# Patient Record
Sex: Female | Born: 1975 | Race: White | Hispanic: No | Marital: Married | Smoking: Never smoker
Health system: Southern US, Community
[De-identification: ages and names within clinical notes are randomized; demographics above are authoritative.]

## PROBLEM LIST (undated history)

## (undated) HISTORY — PX: ABLATION: SHX5711

---

## 2011-05-25 HISTORY — PX: CHOLECYSTECTOMY: SHX55

## 2020-04-23 HISTORY — PX: ANKLE SURGERY: SHX546

## 2020-10-28 ENCOUNTER — Ambulatory Visit (HOSPITAL_COMMUNITY): Admit: 2020-10-28 | Discharge status: Against Medical Advice | Payer: Self-pay

## 2020-10-30 ENCOUNTER — Ambulatory Visit (HOSPITAL_COMMUNITY): Payer: Self-pay

## 2020-12-31 ENCOUNTER — Other Ambulatory Visit: Payer: BC Managed Care – PPO

## 2020-12-31 DIAGNOSIS — Z20822 Contact with and (suspected) exposure to covid-19: Secondary | ICD-10-CM | POA: Diagnosis not present

## 2021-01-01 LAB — NOVEL CORONAVIRUS, NAA: SARS-CoV-2, NAA: NOT DETECTED

## 2021-01-01 LAB — SARS-COV-2, NAA 2 DAY TAT

## 2021-04-30 DIAGNOSIS — M25552 Pain in left hip: Secondary | ICD-10-CM | POA: Diagnosis not present

## 2021-04-30 DIAGNOSIS — S83281A Other tear of lateral meniscus, current injury, right knee, initial encounter: Secondary | ICD-10-CM | POA: Diagnosis not present

## 2021-04-30 DIAGNOSIS — M545 Low back pain, unspecified: Secondary | ICD-10-CM | POA: Diagnosis not present

## 2021-05-05 DIAGNOSIS — M25561 Pain in right knee: Secondary | ICD-10-CM | POA: Diagnosis not present

## 2021-09-09 DIAGNOSIS — N920 Excessive and frequent menstruation with regular cycle: Secondary | ICD-10-CM | POA: Diagnosis not present

## 2021-09-09 DIAGNOSIS — M791 Myalgia, unspecified site: Secondary | ICD-10-CM | POA: Diagnosis not present

## 2021-09-09 DIAGNOSIS — Z01419 Encounter for gynecological examination (general) (routine) without abnormal findings: Secondary | ICD-10-CM | POA: Diagnosis not present

## 2021-09-09 DIAGNOSIS — Z124 Encounter for screening for malignant neoplasm of cervix: Secondary | ICD-10-CM | POA: Diagnosis not present

## 2021-09-09 DIAGNOSIS — Z1231 Encounter for screening mammogram for malignant neoplasm of breast: Secondary | ICD-10-CM | POA: Diagnosis not present

## 2021-09-09 DIAGNOSIS — N926 Irregular menstruation, unspecified: Secondary | ICD-10-CM | POA: Diagnosis not present

## 2021-09-09 DIAGNOSIS — Z139 Encounter for screening, unspecified: Secondary | ICD-10-CM | POA: Diagnosis not present

## 2021-09-22 DIAGNOSIS — R7401 Elevation of levels of liver transaminase levels: Secondary | ICD-10-CM | POA: Diagnosis not present

## 2021-09-22 DIAGNOSIS — Z131 Encounter for screening for diabetes mellitus: Secondary | ICD-10-CM | POA: Diagnosis not present

## 2021-09-22 DIAGNOSIS — N920 Excessive and frequent menstruation with regular cycle: Secondary | ICD-10-CM | POA: Diagnosis not present

## 2021-09-22 DIAGNOSIS — N393 Stress incontinence (female) (male): Secondary | ICD-10-CM | POA: Diagnosis not present

## 2021-10-21 DIAGNOSIS — D259 Leiomyoma of uterus, unspecified: Secondary | ICD-10-CM | POA: Diagnosis not present

## 2021-10-21 DIAGNOSIS — N840 Polyp of corpus uteri: Secondary | ICD-10-CM | POA: Diagnosis not present

## 2021-10-21 DIAGNOSIS — N920 Excessive and frequent menstruation with regular cycle: Secondary | ICD-10-CM | POA: Diagnosis not present

## 2021-10-26 DIAGNOSIS — R7989 Other specified abnormal findings of blood chemistry: Secondary | ICD-10-CM | POA: Diagnosis not present

## 2021-10-26 DIAGNOSIS — Z6833 Body mass index (BMI) 33.0-33.9, adult: Secondary | ICD-10-CM | POA: Diagnosis not present

## 2021-10-26 DIAGNOSIS — E1169 Type 2 diabetes mellitus with other specified complication: Secondary | ICD-10-CM | POA: Diagnosis not present

## 2021-10-26 DIAGNOSIS — Z8719 Personal history of other diseases of the digestive system: Secondary | ICD-10-CM | POA: Diagnosis not present

## 2021-10-26 DIAGNOSIS — R635 Abnormal weight gain: Secondary | ICD-10-CM | POA: Diagnosis not present

## 2021-11-04 DIAGNOSIS — Z09 Encounter for follow-up examination after completed treatment for conditions other than malignant neoplasm: Secondary | ICD-10-CM | POA: Diagnosis not present

## 2021-11-04 DIAGNOSIS — D259 Leiomyoma of uterus, unspecified: Secondary | ICD-10-CM | POA: Diagnosis not present

## 2021-11-04 DIAGNOSIS — N920 Excessive and frequent menstruation with regular cycle: Secondary | ICD-10-CM | POA: Diagnosis not present

## 2021-11-15 DIAGNOSIS — Z8719 Personal history of other diseases of the digestive system: Secondary | ICD-10-CM | POA: Diagnosis not present

## 2021-11-15 DIAGNOSIS — R635 Abnormal weight gain: Secondary | ICD-10-CM | POA: Diagnosis not present

## 2021-11-15 DIAGNOSIS — E1169 Type 2 diabetes mellitus with other specified complication: Secondary | ICD-10-CM | POA: Diagnosis not present

## 2021-11-15 DIAGNOSIS — R7989 Other specified abnormal findings of blood chemistry: Secondary | ICD-10-CM | POA: Diagnosis not present

## 2021-11-16 ENCOUNTER — Encounter: Payer: Self-pay | Admitting: Obstetrics and Gynecology

## 2021-11-26 DIAGNOSIS — N920 Excessive and frequent menstruation with regular cycle: Secondary | ICD-10-CM | POA: Diagnosis not present

## 2021-11-26 DIAGNOSIS — D259 Leiomyoma of uterus, unspecified: Secondary | ICD-10-CM | POA: Diagnosis not present

## 2021-11-29 DIAGNOSIS — M25561 Pain in right knee: Secondary | ICD-10-CM | POA: Diagnosis not present

## 2021-12-14 DIAGNOSIS — R635 Abnormal weight gain: Secondary | ICD-10-CM | POA: Diagnosis not present

## 2021-12-14 DIAGNOSIS — E669 Obesity, unspecified: Secondary | ICD-10-CM | POA: Diagnosis not present

## 2021-12-14 DIAGNOSIS — R748 Abnormal levels of other serum enzymes: Secondary | ICD-10-CM | POA: Diagnosis not present

## 2021-12-21 ENCOUNTER — Other Ambulatory Visit: Payer: Self-pay | Admitting: Gastroenterology

## 2021-12-21 DIAGNOSIS — R7989 Other specified abnormal findings of blood chemistry: Secondary | ICD-10-CM

## 2021-12-23 ENCOUNTER — Other Ambulatory Visit: Payer: Self-pay

## 2021-12-23 ENCOUNTER — Ambulatory Visit (INDEPENDENT_AMBULATORY_CARE_PROVIDER_SITE_OTHER): Payer: BC Managed Care – PPO | Admitting: Internal Medicine

## 2021-12-23 ENCOUNTER — Encounter: Payer: Self-pay | Admitting: Internal Medicine

## 2021-12-23 VITALS — BP 122/80 | HR 100 | Temp 98.3°F | Ht 64.0 in | Wt 206.4 lb

## 2021-12-23 DIAGNOSIS — R7309 Other abnormal glucose: Secondary | ICD-10-CM | POA: Diagnosis not present

## 2021-12-23 DIAGNOSIS — R748 Abnormal levels of other serum enzymes: Secondary | ICD-10-CM | POA: Diagnosis not present

## 2021-12-23 DIAGNOSIS — Z6835 Body mass index (BMI) 35.0-35.9, adult: Secondary | ICD-10-CM

## 2021-12-23 NOTE — Progress Notes (Signed)
?Jeri Cos Llittleton,acting as a Neurosurgeon for Gwynneth Aliment, MD.,have documented all relevant documentation on the behalf of Gwynneth Aliment, MD,as directed by  Gwynneth Aliment, MD while in the presence of Gwynneth Aliment, MD.  ?This visit occurred during the SARS-CoV-2 public health emergency.  Safety protocols were in place, including screening questions prior to the visit, additional usage of staff PPE, and extensive cleaning of exam room while observing appropriate contact time as indicated for disinfecting solutions. ? ?Subjective:  ?  ? Patient ID: Heidi Wilson , female    DOB: [birth year] , 46 y.o.   MRN: [id number] ? ? ?Chief Complaint  ?Patient presents with  ? Establish Care  ? Weight Check  ? ? ?HPI ? ?Patient presents today to establish primary care. She was referred by her GYN, Dr. Su Hilt for elevated a1c. She moved here from [place] in Aug of 2021. She is originally from [place].  Her initial evaluation was with Dr. Su Hilt in Nov 2022. AT that visit, she was found to have elevated LFTs, and elevated blood sugars. States from Feb 2022 through July 2022 , she had decreased mobility due to left ankle pain, which she thinks contributed to both conditions.  ? ?She was also sent to Endocrinology for further evaluation of elevated sugars, but was not started on any medication.  ? ?  ? ?History reviewed. No pertinent past medical history.  ? ?Family History  ?Problem Relation Age of Onset  ? Hyperlipidemia Father   ? Hypertension Father   ? Rheum arthritis Maternal Grandmother   ? Heart attack Maternal Grandfather   ? Diabetes Paternal Grandmother   ? Heart attack Paternal Grandfather   ? ? ?No current outpatient medications on file.  ? ?No Known Allergies  ? ?Review of Systems  ?Constitutional: Negative.   ?Respiratory: Negative.    ?Cardiovascular: Negative.   ?Gastrointestinal: Negative.   ?Neurological: Negative.   ?Psychiatric/Behavioral: Negative.     ? ?Today's Vitals  ? 12/23/21 1156  ?BP:  122/80  ?Pulse: 100  ?Temp: 98.3 ?F (36.8 ?C)  ?Weight: 206 lb 6.4 oz (93.6 kg)  ?Height: 5\' 4"  (1.626 m)  ?PainSc: 0-No pain  ? ?Body mass index is 35.43 kg/m?.  ?Wt Readings from Last 3 Encounters:  ?12/23/21 206 lb 6.4 oz (93.6 kg)  ?  ? ?Objective:  ?Physical Exam ?Vitals and nursing note reviewed.  ?Constitutional:   ?   Appearance: Normal appearance. She is obese.  ?HENT:  ?   Head: Normocephalic and atraumatic.  ?   Nose:  ?   Comments: Masked  ?   Mouth/Throat:  ?   Comments: Masked  ?Eyes:  ?   Extraocular Movements: Extraocular movements intact.  ?Cardiovascular:  ?   Rate and Rhythm: Normal rate and regular rhythm.  ?   Heart sounds: Normal heart sounds.  ?Pulmonary:  ?   Effort: Pulmonary effort is normal.  ?   Breath sounds: Normal breath sounds.  ?Musculoskeletal:  ?   Cervical back: Normal range of motion.  ?Skin: ?   General: Skin is warm.  ?Neurological:  ?   General: No focal deficit present.  ?   Mental Status: She is alert.  ?Psychiatric:     ?   Mood and Affect: Mood normal.     ?   Behavior: Behavior normal.  ?  ? ?   ?Assessment And Plan:  ?   ?1. Elevated liver enzymes ?Comments: Declines repeat labs today. She is encouraged  to decrease sugar intake, increase daily activity and may benefit from milk thistle.  ? ?2. Elevated glucose ?Comments: I did review some labwork she had from [place] during her visit. Her a1c was greater than 6.5 (9.8) with Dr. Su Hilt. I will request her records from Endocrinology. She does not wish to start meds, although would benefit from Metformin and/or GLP-1 agonist. Pt advised that I will be able to make further recommendations once I receive her records from Endo. She did not appear to be accepting of any pharmacologic therapy at this time.  ? ?3. Body mass index (BMI) of 35.0 to 35.9 in adult ?Comments: She is encouraged to strive ofr BMI <30 to decrease cardiac risk. Advised to aim for at least 150 minutes of exercise per week.  ?  ?Patient was given  opportunity to ask questions. Patient verbalized understanding of the plan and was able to repeat key elements of the plan. All questions were answered to their satisfaction.  ? ?I, Gwynneth Aliment, MD, have reviewed all documentation for this visit. The documentation on 12/23/21 for the exam, diagnosis, procedures, and orders are all accurate and complete.  ? ?IF YOU HAVE BEEN REFERRED TO A SPECIALIST, IT MAY TAKE 1-2 WEEKS TO SCHEDULE/PROCESS THE REFERRAL. IF YOU HAVE NOT HEARD FROM US/SPECIALIST IN TWO WEEKS, PLEASE GIVE [place] A CALL AT [phone].  ? ?THE PATIENT IS ENCOURAGED TO PRACTICE SOCIAL DISTANCING DUE TO THE COVID-19 PANDEMIC.   ?

## 2021-12-27 ENCOUNTER — Other Ambulatory Visit: Payer: BC Managed Care – PPO

## 2021-12-29 ENCOUNTER — Ambulatory Visit
Admission: RE | Admit: 2021-12-29 | Discharge: 2021-12-29 | Disposition: A | Discharge status: Home / Self Care | Payer: BC Managed Care – PPO | Source: Ambulatory Visit | Attending: Gastroenterology | Admitting: Gastroenterology

## 2021-12-29 DIAGNOSIS — R7989 Other specified abnormal findings of blood chemistry: Secondary | ICD-10-CM

## 2021-12-31 ENCOUNTER — Other Ambulatory Visit: Payer: Self-pay | Admitting: Gastroenterology

## 2021-12-31 ENCOUNTER — Other Ambulatory Visit (HOSPITAL_COMMUNITY): Payer: Self-pay | Admitting: Gastroenterology

## 2021-12-31 DIAGNOSIS — R935 Abnormal findings on diagnostic imaging of other abdominal regions, including retroperitoneum: Secondary | ICD-10-CM

## 2021-12-31 DIAGNOSIS — R7989 Other specified abnormal findings of blood chemistry: Secondary | ICD-10-CM

## 2022-01-10 ENCOUNTER — Other Ambulatory Visit: Payer: Self-pay

## 2022-01-10 ENCOUNTER — Ambulatory Visit (HOSPITAL_COMMUNITY)
Admission: RE | Admit: 2022-01-10 | Discharge: 2022-01-10 | Disposition: A | Discharge status: Home / Self Care | Payer: BC Managed Care – PPO | Source: Ambulatory Visit | Attending: Gastroenterology | Admitting: Gastroenterology

## 2022-01-10 DIAGNOSIS — R935 Abnormal findings on diagnostic imaging of other abdominal regions, including retroperitoneum: Secondary | ICD-10-CM | POA: Diagnosis not present

## 2022-01-10 DIAGNOSIS — K7689 Other specified diseases of liver: Secondary | ICD-10-CM | POA: Diagnosis not present

## 2022-01-10 DIAGNOSIS — R7989 Other specified abnormal findings of blood chemistry: Secondary | ICD-10-CM | POA: Insufficient documentation

## 2022-01-12 DIAGNOSIS — M25561 Pain in right knee: Secondary | ICD-10-CM | POA: Diagnosis not present

## 2022-01-20 DIAGNOSIS — M25561 Pain in right knee: Secondary | ICD-10-CM | POA: Diagnosis not present

## 2022-01-26 DIAGNOSIS — M25561 Pain in right knee: Secondary | ICD-10-CM | POA: Diagnosis not present

## 2022-02-07 DIAGNOSIS — M2241 Chondromalacia patellae, right knee: Secondary | ICD-10-CM | POA: Diagnosis not present

## 2022-02-07 DIAGNOSIS — M6281 Muscle weakness (generalized): Secondary | ICD-10-CM | POA: Diagnosis not present

## 2022-02-14 DIAGNOSIS — R7989 Other specified abnormal findings of blood chemistry: Secondary | ICD-10-CM | POA: Diagnosis not present

## 2022-02-17 DIAGNOSIS — M2241 Chondromalacia patellae, right knee: Secondary | ICD-10-CM | POA: Diagnosis not present

## 2022-02-17 DIAGNOSIS — M6281 Muscle weakness (generalized): Secondary | ICD-10-CM | POA: Diagnosis not present

## 2022-02-21 DIAGNOSIS — E1169 Type 2 diabetes mellitus with other specified complication: Secondary | ICD-10-CM | POA: Diagnosis not present

## 2022-02-21 DIAGNOSIS — M6281 Muscle weakness (generalized): Secondary | ICD-10-CM | POA: Diagnosis not present

## 2022-02-21 DIAGNOSIS — R7989 Other specified abnormal findings of blood chemistry: Secondary | ICD-10-CM | POA: Diagnosis not present

## 2022-02-21 DIAGNOSIS — M2241 Chondromalacia patellae, right knee: Secondary | ICD-10-CM | POA: Diagnosis not present

## 2022-02-21 DIAGNOSIS — R635 Abnormal weight gain: Secondary | ICD-10-CM | POA: Diagnosis not present

## 2022-02-21 DIAGNOSIS — Z8719 Personal history of other diseases of the digestive system: Secondary | ICD-10-CM | POA: Diagnosis not present

## 2022-02-28 DIAGNOSIS — M2241 Chondromalacia patellae, right knee: Secondary | ICD-10-CM | POA: Diagnosis not present

## 2022-02-28 DIAGNOSIS — M6281 Muscle weakness (generalized): Secondary | ICD-10-CM | POA: Diagnosis not present

## 2022-03-07 DIAGNOSIS — M6281 Muscle weakness (generalized): Secondary | ICD-10-CM | POA: Diagnosis not present

## 2022-03-07 DIAGNOSIS — M2241 Chondromalacia patellae, right knee: Secondary | ICD-10-CM | POA: Diagnosis not present

## 2022-03-09 DIAGNOSIS — M2241 Chondromalacia patellae, right knee: Secondary | ICD-10-CM | POA: Diagnosis not present

## 2022-06-28 DIAGNOSIS — E1169 Type 2 diabetes mellitus with other specified complication: Secondary | ICD-10-CM | POA: Diagnosis not present

## 2022-06-28 DIAGNOSIS — K7581 Nonalcoholic steatohepatitis (NASH): Secondary | ICD-10-CM | POA: Diagnosis not present

## 2022-06-29 DIAGNOSIS — M2241 Chondromalacia patellae, right knee: Secondary | ICD-10-CM | POA: Diagnosis not present

## 2022-08-09 DIAGNOSIS — Z6833 Body mass index (BMI) 33.0-33.9, adult: Secondary | ICD-10-CM | POA: Diagnosis not present

## 2022-08-09 DIAGNOSIS — Z794 Long term (current) use of insulin: Secondary | ICD-10-CM | POA: Diagnosis not present

## 2022-08-09 DIAGNOSIS — E1169 Type 2 diabetes mellitus with other specified complication: Secondary | ICD-10-CM | POA: Diagnosis not present

## 2022-08-15 DIAGNOSIS — K7581 Nonalcoholic steatohepatitis (NASH): Secondary | ICD-10-CM | POA: Diagnosis not present

## 2022-08-15 DIAGNOSIS — Z23 Encounter for immunization: Secondary | ICD-10-CM | POA: Diagnosis not present

## 2022-08-15 DIAGNOSIS — E1169 Type 2 diabetes mellitus with other specified complication: Secondary | ICD-10-CM | POA: Diagnosis not present

## 2022-08-15 DIAGNOSIS — E1165 Type 2 diabetes mellitus with hyperglycemia: Secondary | ICD-10-CM | POA: Diagnosis not present

## 2022-09-21 DIAGNOSIS — E1169 Type 2 diabetes mellitus with other specified complication: Secondary | ICD-10-CM | POA: Diagnosis not present

## 2022-09-21 DIAGNOSIS — I10 Essential (primary) hypertension: Secondary | ICD-10-CM | POA: Diagnosis not present

## 2022-09-21 DIAGNOSIS — K7581 Nonalcoholic steatohepatitis (NASH): Secondary | ICD-10-CM | POA: Diagnosis not present

## 2022-10-12 DIAGNOSIS — M9901 Segmental and somatic dysfunction of cervical region: Secondary | ICD-10-CM | POA: Diagnosis not present

## 2022-10-12 DIAGNOSIS — M9902 Segmental and somatic dysfunction of thoracic region: Secondary | ICD-10-CM | POA: Diagnosis not present

## 2022-10-12 DIAGNOSIS — M7541 Impingement syndrome of right shoulder: Secondary | ICD-10-CM | POA: Diagnosis not present

## 2022-10-12 DIAGNOSIS — M9907 Segmental and somatic dysfunction of upper extremity: Secondary | ICD-10-CM | POA: Diagnosis not present

## 2022-10-14 DIAGNOSIS — M9902 Segmental and somatic dysfunction of thoracic region: Secondary | ICD-10-CM | POA: Diagnosis not present

## 2022-10-14 DIAGNOSIS — M7541 Impingement syndrome of right shoulder: Secondary | ICD-10-CM | POA: Diagnosis not present

## 2022-10-14 DIAGNOSIS — M9907 Segmental and somatic dysfunction of upper extremity: Secondary | ICD-10-CM | POA: Diagnosis not present

## 2022-10-14 DIAGNOSIS — M9901 Segmental and somatic dysfunction of cervical region: Secondary | ICD-10-CM | POA: Diagnosis not present

## 2022-10-18 DIAGNOSIS — M9901 Segmental and somatic dysfunction of cervical region: Secondary | ICD-10-CM | POA: Diagnosis not present

## 2022-10-18 DIAGNOSIS — M7541 Impingement syndrome of right shoulder: Secondary | ICD-10-CM | POA: Diagnosis not present

## 2022-10-18 DIAGNOSIS — M9907 Segmental and somatic dysfunction of upper extremity: Secondary | ICD-10-CM | POA: Diagnosis not present

## 2022-10-18 DIAGNOSIS — M9902 Segmental and somatic dysfunction of thoracic region: Secondary | ICD-10-CM | POA: Diagnosis not present

## 2022-10-25 DIAGNOSIS — M9907 Segmental and somatic dysfunction of upper extremity: Secondary | ICD-10-CM | POA: Diagnosis not present

## 2022-10-25 DIAGNOSIS — M9902 Segmental and somatic dysfunction of thoracic region: Secondary | ICD-10-CM | POA: Diagnosis not present

## 2022-10-25 DIAGNOSIS — M9901 Segmental and somatic dysfunction of cervical region: Secondary | ICD-10-CM | POA: Diagnosis not present

## 2022-10-25 DIAGNOSIS — M7541 Impingement syndrome of right shoulder: Secondary | ICD-10-CM | POA: Diagnosis not present

## 2022-11-02 DIAGNOSIS — M9907 Segmental and somatic dysfunction of upper extremity: Secondary | ICD-10-CM | POA: Diagnosis not present

## 2022-11-02 DIAGNOSIS — M9901 Segmental and somatic dysfunction of cervical region: Secondary | ICD-10-CM | POA: Diagnosis not present

## 2022-11-02 DIAGNOSIS — M9902 Segmental and somatic dysfunction of thoracic region: Secondary | ICD-10-CM | POA: Diagnosis not present

## 2022-11-02 DIAGNOSIS — M7541 Impingement syndrome of right shoulder: Secondary | ICD-10-CM | POA: Diagnosis not present

## 2022-11-10 DIAGNOSIS — M9902 Segmental and somatic dysfunction of thoracic region: Secondary | ICD-10-CM | POA: Diagnosis not present

## 2022-11-10 DIAGNOSIS — M9907 Segmental and somatic dysfunction of upper extremity: Secondary | ICD-10-CM | POA: Diagnosis not present

## 2022-11-10 DIAGNOSIS — M9901 Segmental and somatic dysfunction of cervical region: Secondary | ICD-10-CM | POA: Diagnosis not present

## 2022-11-10 DIAGNOSIS — M7541 Impingement syndrome of right shoulder: Secondary | ICD-10-CM | POA: Diagnosis not present

## 2022-12-15 DIAGNOSIS — M9907 Segmental and somatic dysfunction of upper extremity: Secondary | ICD-10-CM | POA: Diagnosis not present

## 2022-12-15 DIAGNOSIS — M9902 Segmental and somatic dysfunction of thoracic region: Secondary | ICD-10-CM | POA: Diagnosis not present

## 2022-12-15 DIAGNOSIS — M7541 Impingement syndrome of right shoulder: Secondary | ICD-10-CM | POA: Diagnosis not present

## 2022-12-15 DIAGNOSIS — M9901 Segmental and somatic dysfunction of cervical region: Secondary | ICD-10-CM | POA: Diagnosis not present

## 2023-01-27 DIAGNOSIS — M25572 Pain in left ankle and joints of left foot: Secondary | ICD-10-CM | POA: Diagnosis not present

## 2023-02-13 DIAGNOSIS — M25561 Pain in right knee: Secondary | ICD-10-CM | POA: Diagnosis not present

## 2023-02-13 DIAGNOSIS — M25572 Pain in left ankle and joints of left foot: Secondary | ICD-10-CM | POA: Diagnosis not present

## 2023-03-08 DIAGNOSIS — M79675 Pain in left toe(s): Secondary | ICD-10-CM | POA: Diagnosis not present

## 2023-09-05 IMAGING — US US ABDOMEN LIMITED
1 series · 14 of 25 positions shown · non-contrast
Comparison: None.

CLINICAL DATA: Elevated LFTs.

EXAM:
ULTRASOUND ABDOMEN LIMITED RIGHT UPPER QUADRANT

[Series 1: us abdomen limited · 0.30mm/px · 14 of 36 slices shown]
[im 1/36]
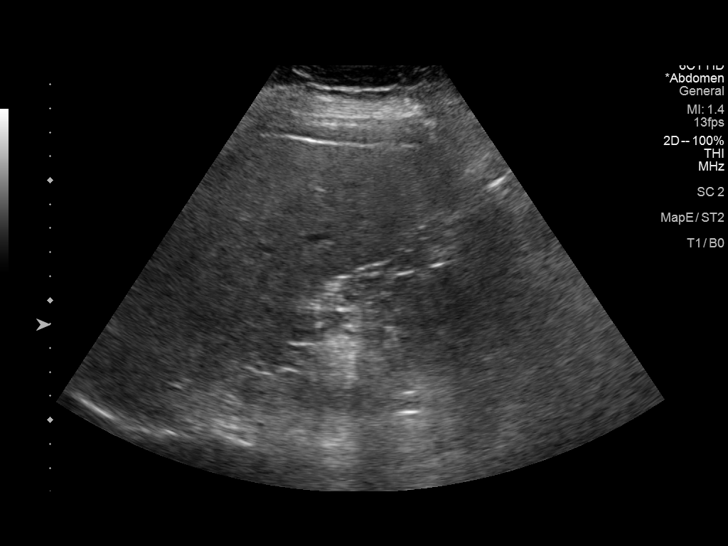
[im 3/36]
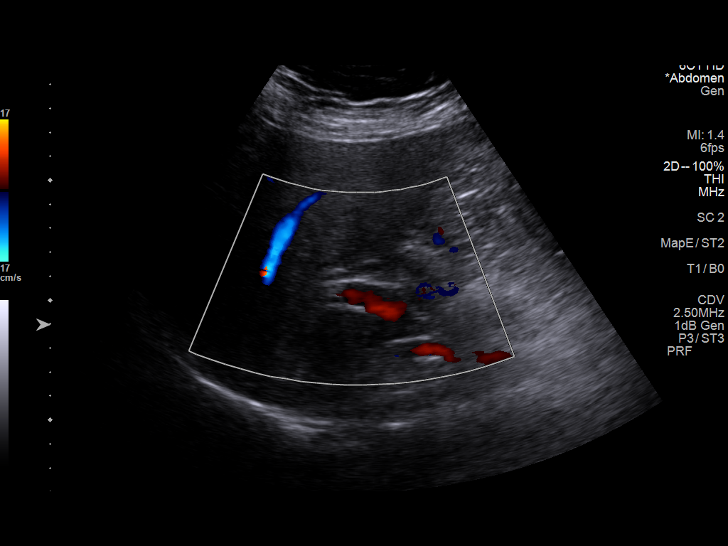
[im 6/36]
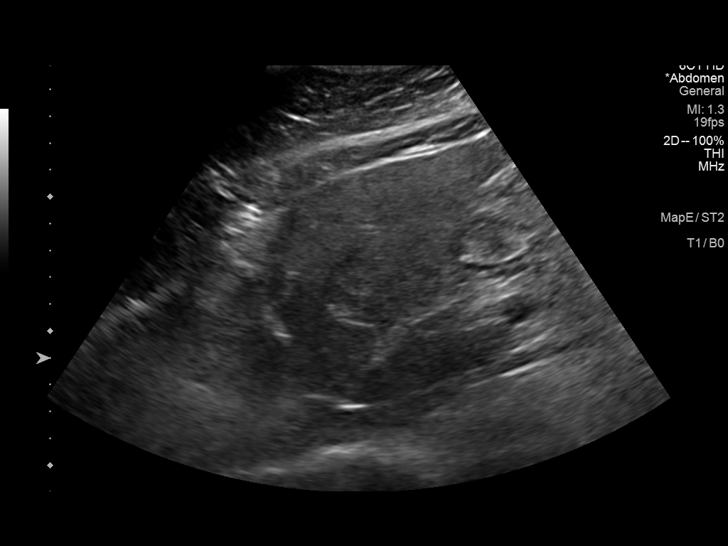
[im 9/36]
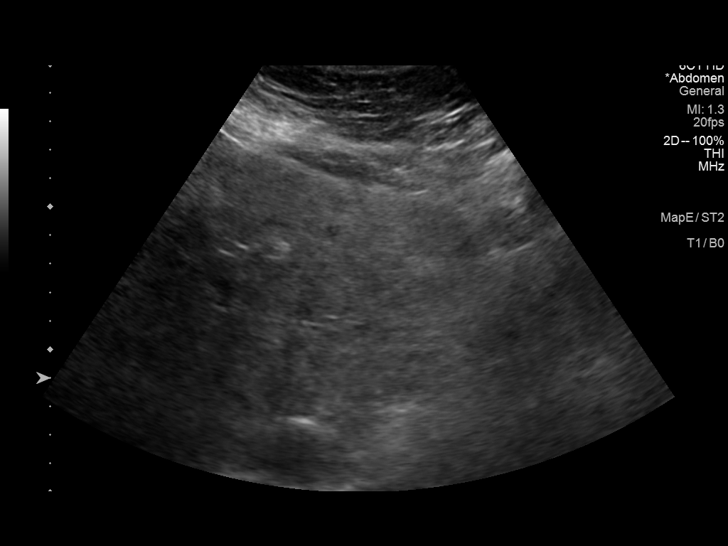
[im 12/36]
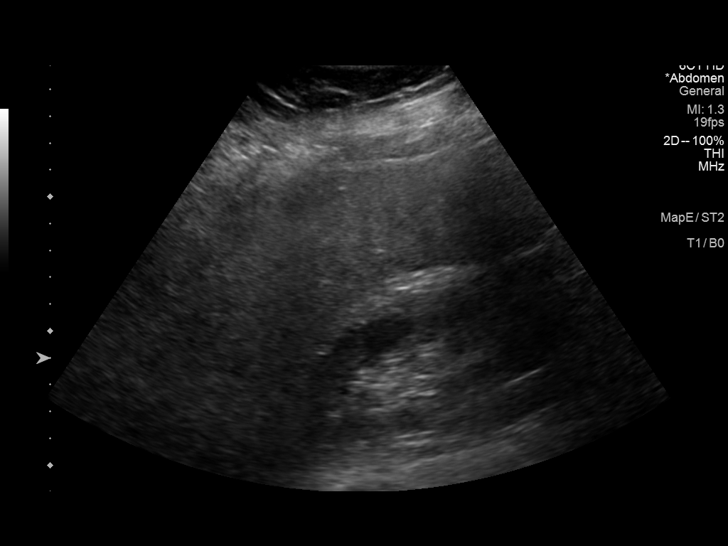
[im 14/36]
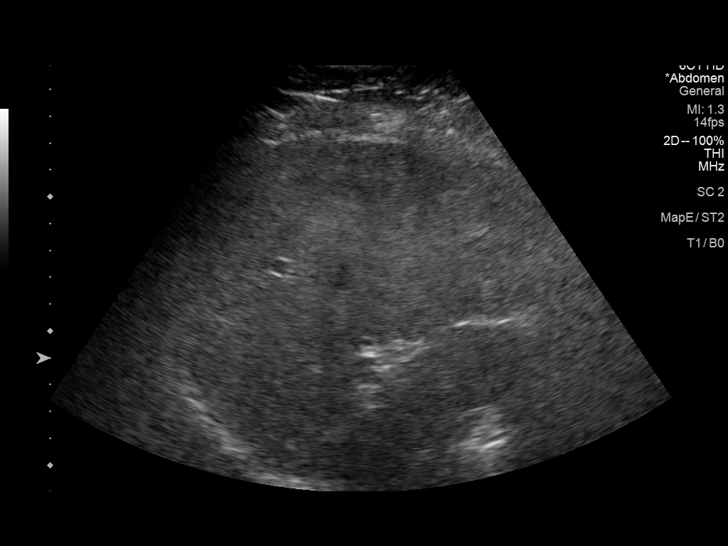
[im 17/36]
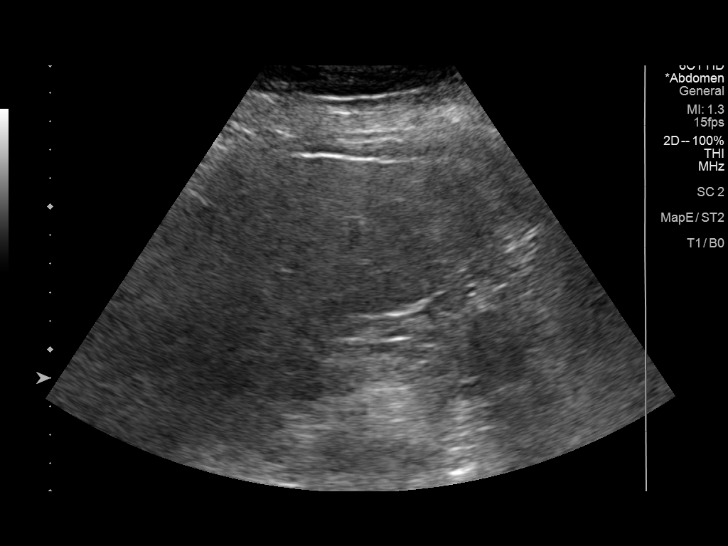
[im 19/36]
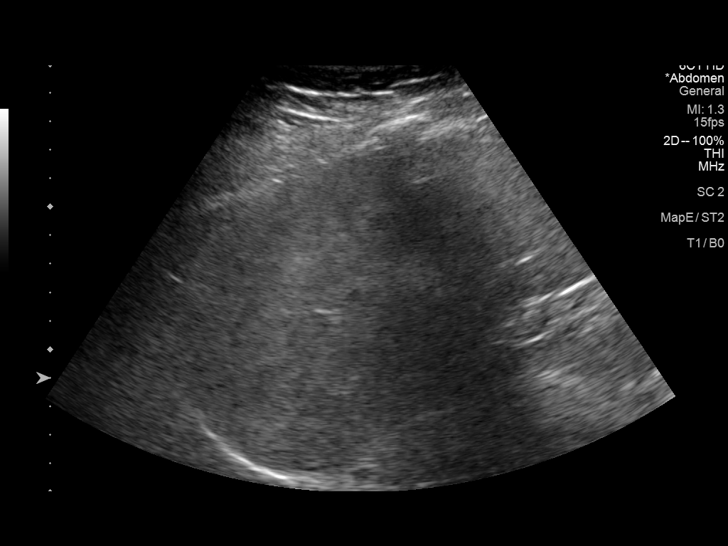
[im 22/36]
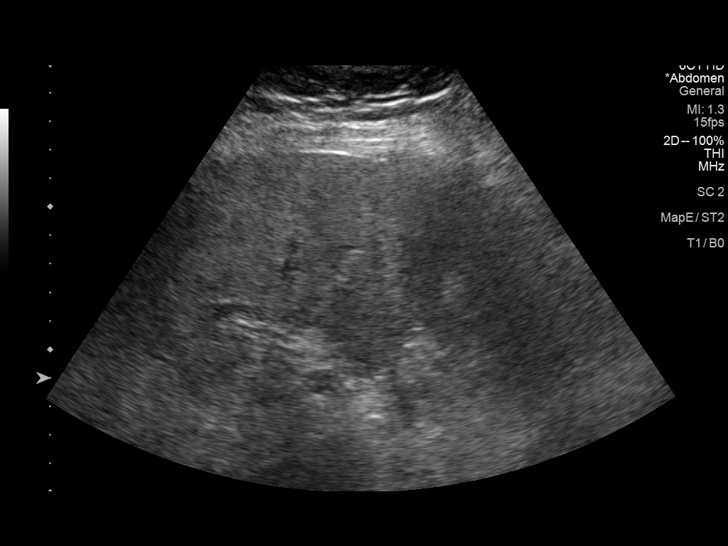
[im 24/36]
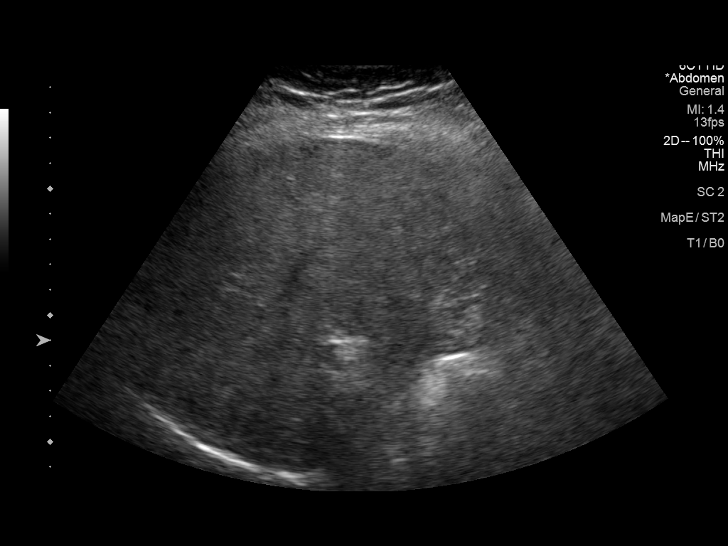
[im 27/36]
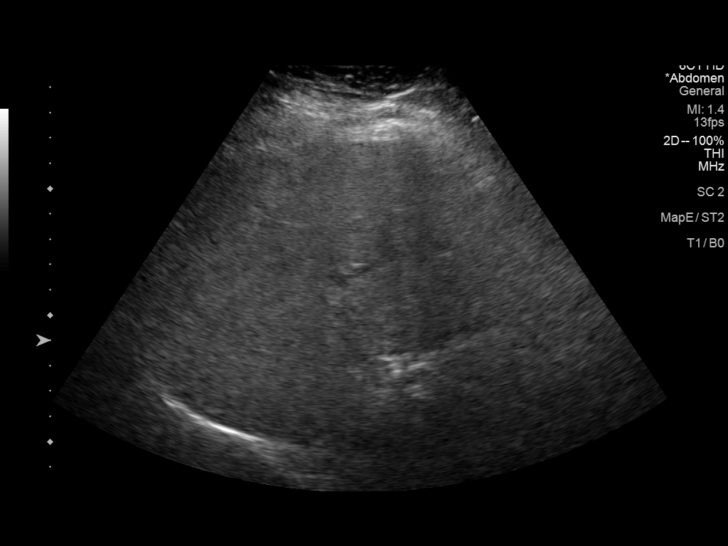
[im 30/36]
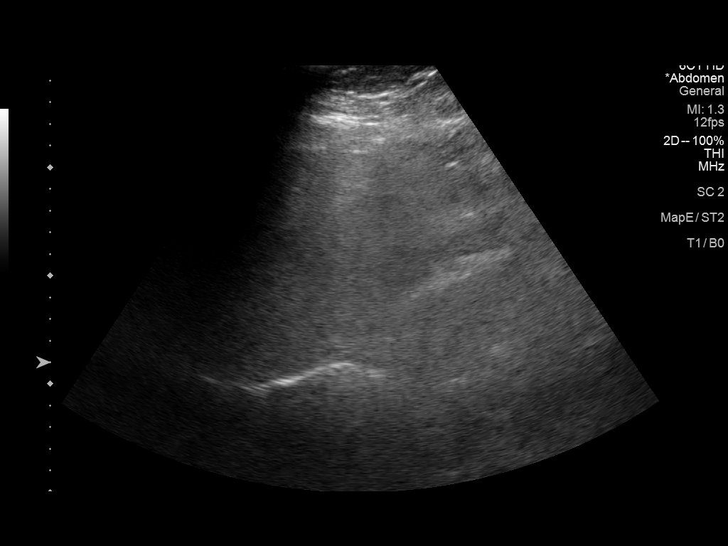
[im 33/36]
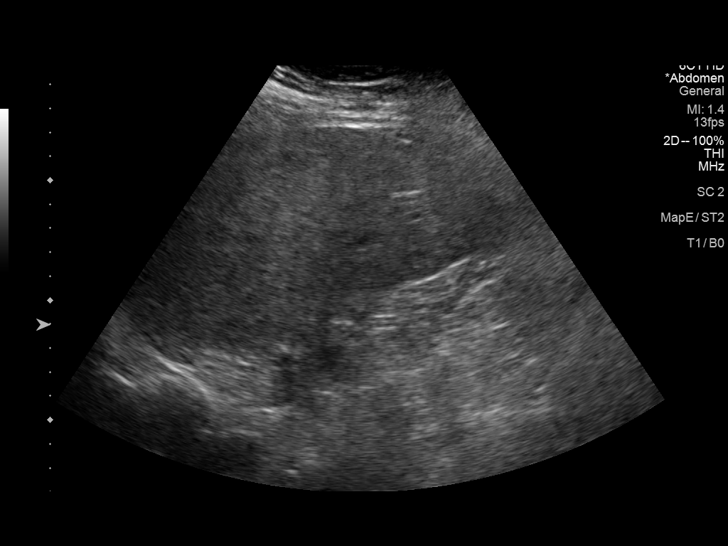
[im 36/36]
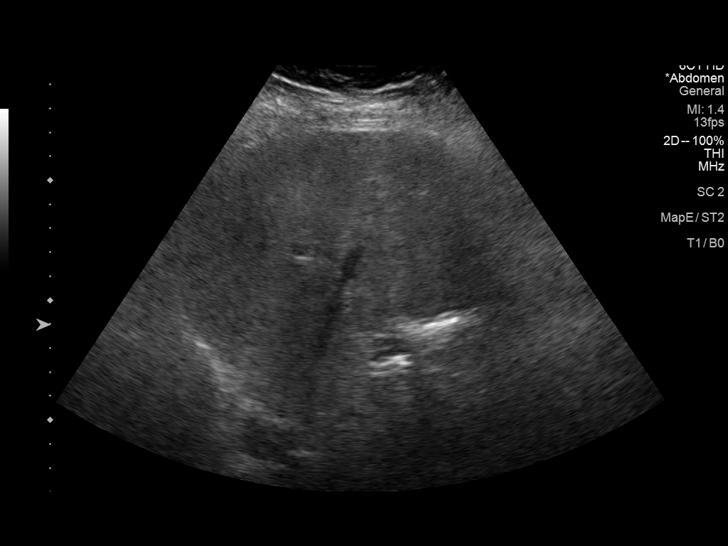

[14 of 25 positions shown; findings below may reference images not displayed]

FINDINGS: Gallbladder:

The gallbladder is surgically absent.

Common bile duct:

Diameter: 5 mm within normal limits.

Liver:

Smooth liver borders. No focal lesion is seen. There is
heterogeneous hepatic echotexture. Portal vein is patent on color
Doppler imaging with normal direction of blood flow towards the
liver.

Other: None.
IMPRESSION: :
IMPRESSION: 1. Status post cholecystectomy.
2. There is a coarsened hepatic echotexture which is nonspecific but
can be seen in the settings of fatty infiltration, fibrosis, and/or
hepatitis.

## 2023-09-17 IMAGING — US US LIVER ELASTOGRAPHY
2 series · 12 of 25 positions shown · non-contrast
Comparison: 12/29/2021

CLINICAL DATA: Elevated liver function tests.

EXAM:
US LIVER ELASTOGRAPHY
TECHNIQUE: Sonography of the liver was performed. In addition, ultrasound
elastography evaluation of the liver was performed. A region of
interest was placed within the right lobe of the liver. Following
application of a compressive sonographic pulse, tissue
compressibility was assessed. Multiple assessments were performed at
the selected site. Median tissue compressibility was determined.
Previously, hepatic stiffness was assessed by shear wave velocity.
Based on recently published Society of [employer]iologists in Ultrasound
consensus article, reporting is now recommended to be performed in
the SI units of pressure (kiloPascals) representing hepatic
stiffness/elasticity. The obtained result is compared to the
published reference standards. (cACLD = compensated Advanced Chronic
Liver Disease)

[Series 1: us elastography liver · 6 of 28 slices shown]
[im 3/28]
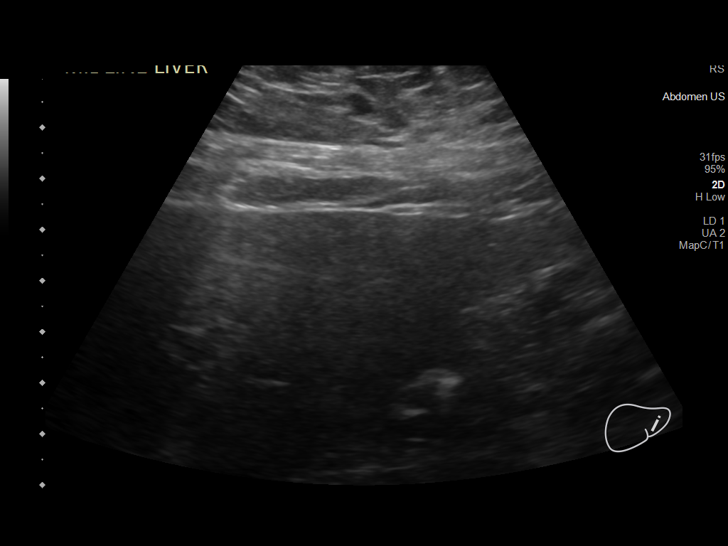
[im 7/28]
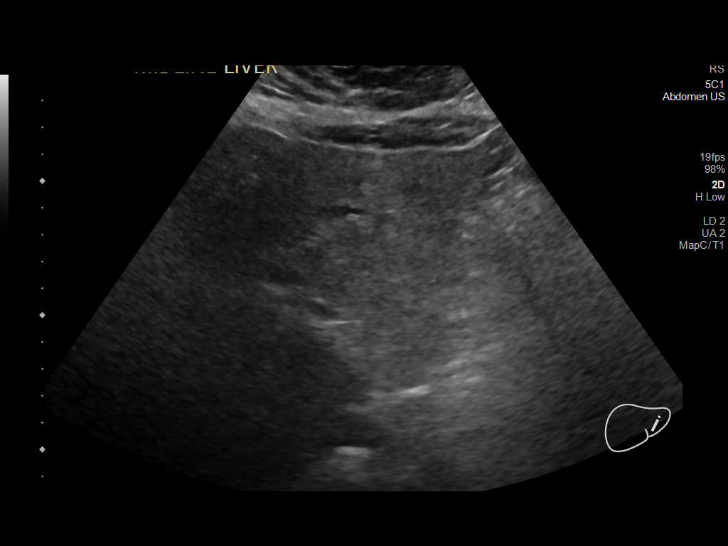
[im 12/28]
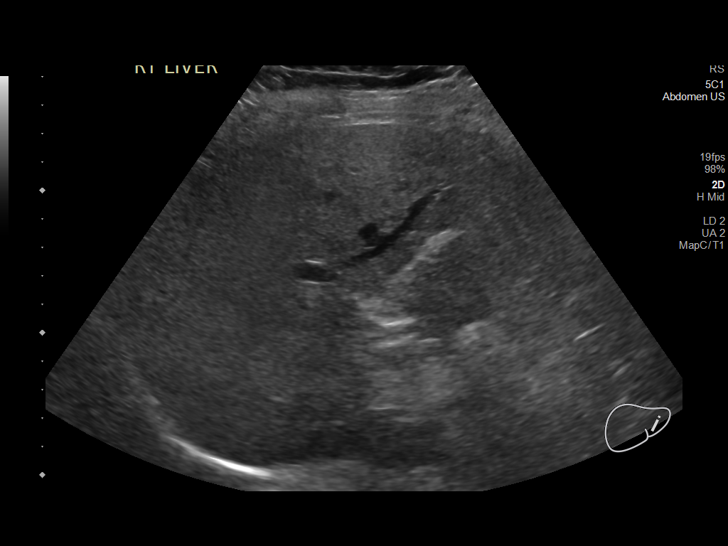
[im 16/28]
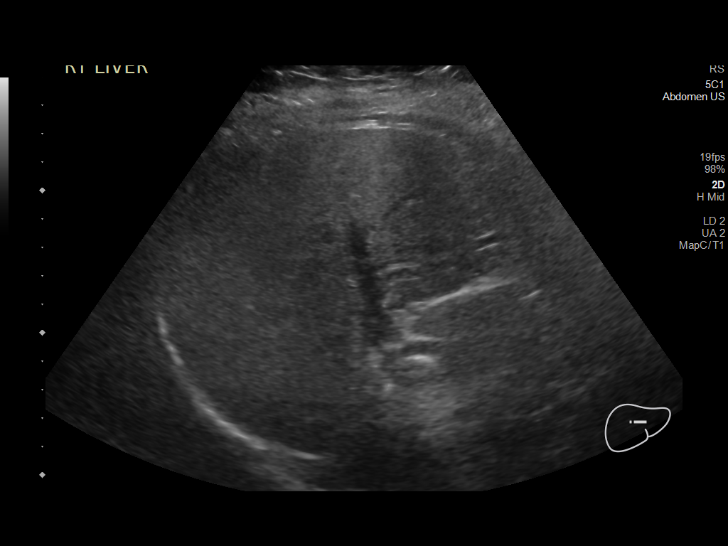
[im 21/28]
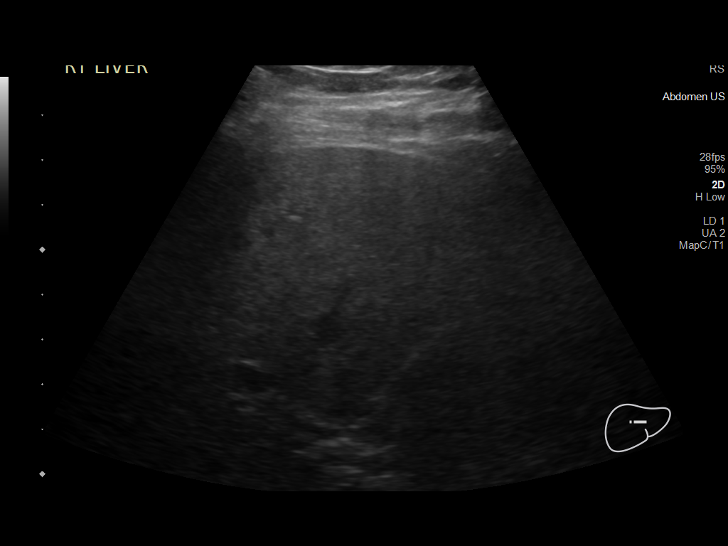
[im 25/28]
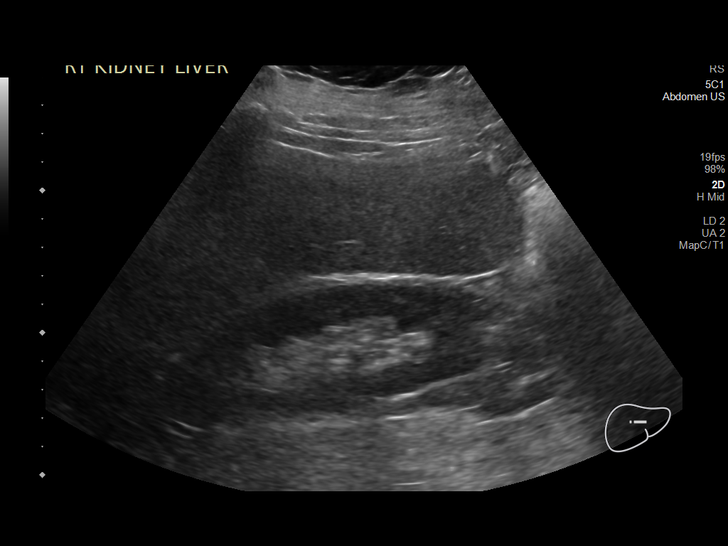

[Series 1001: abdomen us · 6 of 26 slices shown]
[im 1/26]
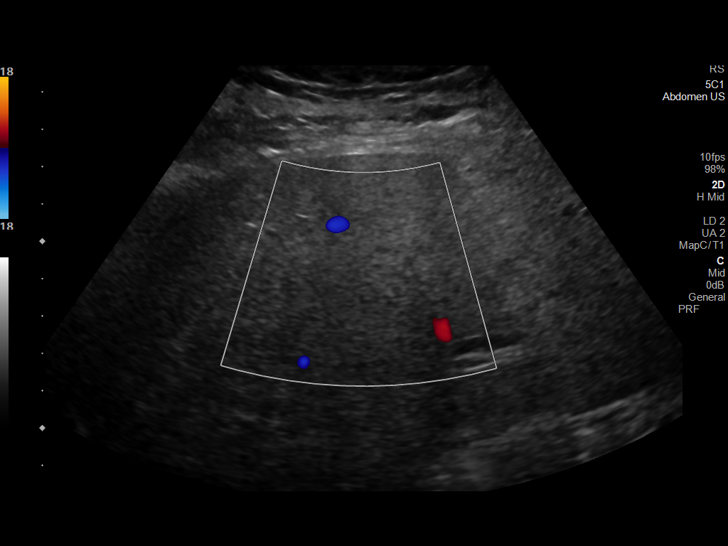
[im 5/26]
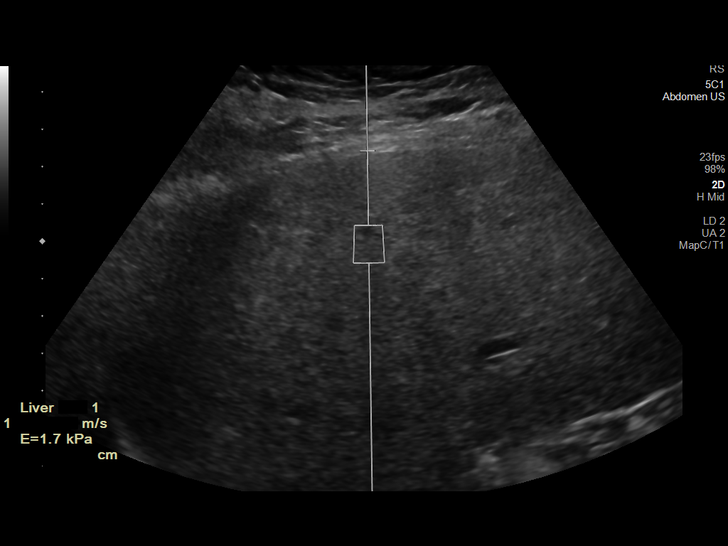
[im 10/26]
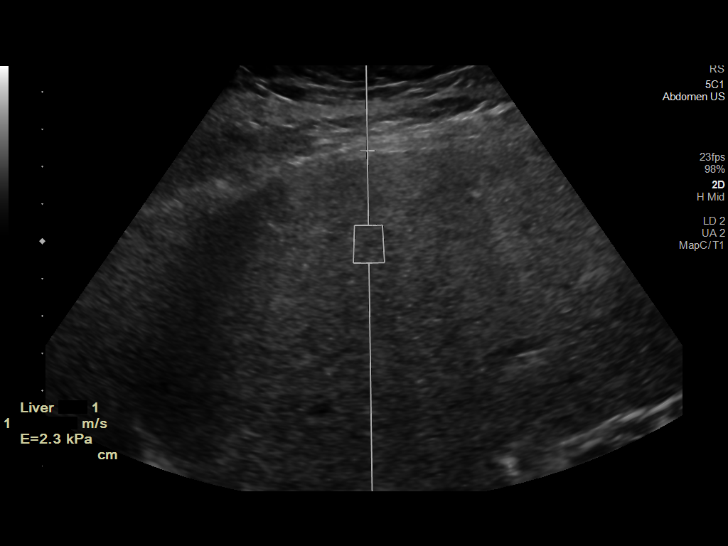
[im 14/26]
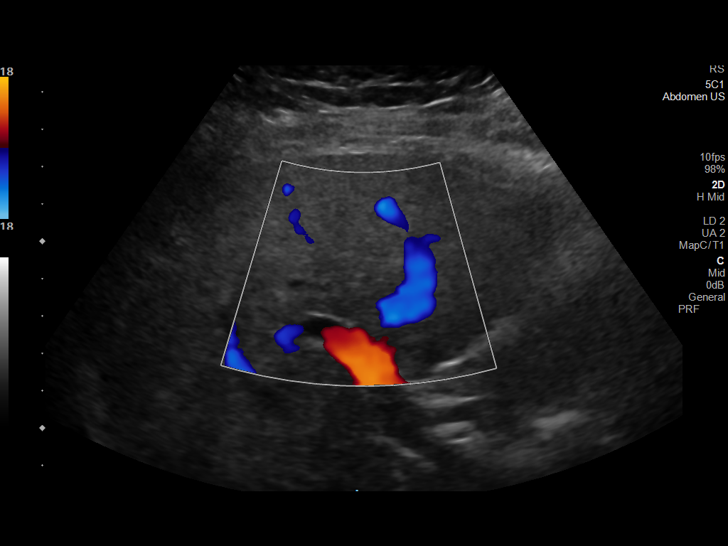
[im 19/26]
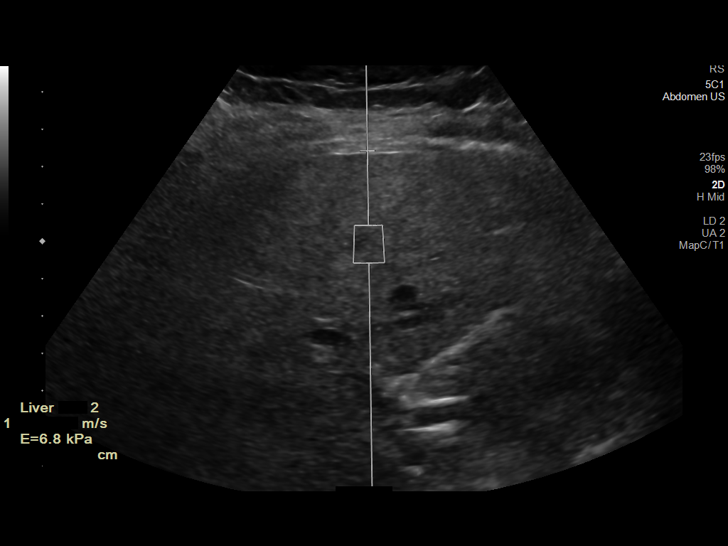
[im 23/26]
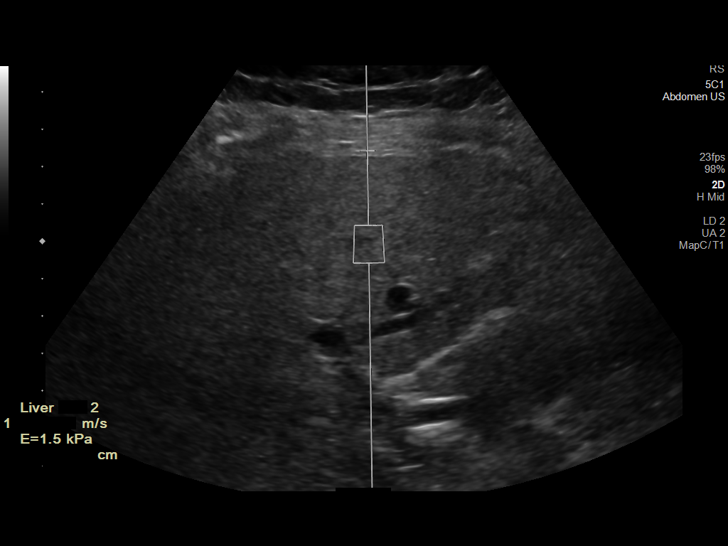

[12 of 25 positions shown; findings below may reference images not displayed]

FINDINGS: Liver: Mildly increased and coarsened echotexture of the hepatic
parenchyma is again seen, consistent with diffuse hepatocellular
disease. No hepatic masses are identified. Portal vein is patent on
color Doppler imaging with normal direction of blood flow towards
the liver.

ULTRASOUND HEPATIC ELASTOGRAPHY

Device: Siemens Helix VTQ

Patient position: Oblique

Transducer: [id number]

Number of measurements: 10

Hepatic segment:  8

Median kPa:

IQR:

IQR/Median kPa ratio:

Data quality: IQR/Median kPa ratio of 0.3 or greater indicates
reduced accuracy

Diagnostic category:  < or = 5 kPa: high probability of being normal

The use of hepatic elastography is applicable to patients with viral
hepatitis and non-alcoholic fatty liver disease. At this time, there
is insufficient data for the referenced cut-off values and use in
other causes of liver disease, including alcoholic liver disease.
Patients, however, may be assessed by elastography and serve as
their own reference standard/baseline.

In patients with non-alcoholic liver disease, the values suggesting
compensated advanced chronic liver disease (cACLD) may be lower, and
patients may need additional testing with elasticity results of [DATE]
kPa.

Please note that abnormal hepatic elasticity and shear wave
velocities may also be identified in clinical settings other than
with hepatic fibrosis, such as: acute hepatitis, elevated right
heart and central venous pressures including use of beta blockers,
Afolki disease ([place]), infiltrative processes such as
mastocytosis/amyloidosis/infiltrative tumor/lymphoma, extrahepatic
cholestasis, with hyperemia in the post-prandial state, and with
liver transplantation. Correlation with patient history, laboratory
data, and clinical condition recommended.

Diagnostic Categories:

< or =5 kPa: high probability of being normal

< or =9 kPa: in the absence of other known clinical signs, rules [DATE] kPa and ?13 kPa: suggestive of cACLD, but needs further testing

>13 kPa: highly suggestive of cACLD

> or =17 kPa: highly suggestive of cACLD with an increased
probability of clinically significant portal hypertension
IMPRESSION: ULTRASOUND LIVER:

Mildly increased and coarsened echotexture, consistent with diffuse
hepatocellular disease. No focal liver lesions identified.

ULTRASOUND HEPATIC ELASTOGRAPHY:

Median kPa:

Diagnostic category: < or = 5 kPa: high probability of being normal,
note that elevated IQR/median kPa ratio indicates reduced accuracy
of these results.

## 2024-08-15 DIAGNOSIS — Z23 Encounter for immunization: Secondary | ICD-10-CM | POA: Diagnosis not present
